# Patient Record
Sex: Female | Born: 1965 | Race: White | Hispanic: No | Smoking: Current every day smoker
Health system: Southern US, Community
[De-identification: ages and names within clinical notes are randomized; demographics above are authoritative.]

## PROBLEM LIST (undated history)

## (undated) DIAGNOSIS — I447 Left bundle-branch block, unspecified: Secondary | ICD-10-CM

## (undated) HISTORY — PX: NECK SURGERY: SHX720

---

## 2007-04-12 ENCOUNTER — Ambulatory Visit: Payer: Self-pay | Admitting: Obstetrics & Gynecology

## 2007-04-12 ENCOUNTER — Encounter: Payer: Self-pay | Admitting: Obstetrics & Gynecology

## 2007-05-09 ENCOUNTER — Ambulatory Visit: Payer: Self-pay | Admitting: Obstetrics & Gynecology

## 2007-06-01 ENCOUNTER — Ambulatory Visit: Payer: Self-pay | Admitting: Plastic Surgery

## 2008-02-05 ENCOUNTER — Ambulatory Visit: Payer: Self-pay | Admitting: Family Medicine

## 2010-04-07 ENCOUNTER — Ambulatory Visit: Payer: Self-pay | Admitting: Obstetrics & Gynecology

## 2010-07-19 ENCOUNTER — Ambulatory Visit: Payer: Self-pay | Admitting: Obstetrics and Gynecology

## 2011-04-26 NOTE — Assessment & Plan Note (Signed)
[name], [name]               ACCOUNT NO.:  [id number]   MEDICAL RECORD NO.:  [id number]          PATIENT TYPE:  POB   LOCATION:  [facility] at [facility]         FACILITY:  [facility]   PHYSICIAN:  Scheryl Darter, MD       DATE OF BIRTH:  02-May-1966   DATE OF SERVICE:                                  CLINIC NOTE   The patient comes for her annual physical exam.  The patient is a 45-  year-old white female, gravida 2, para 1-0-1-1, who has a Mirena in  place since May of 2008.  She occasionally has a light period.  This may  happen every 2 or 3 months.  She does have some cramping during this  period.  She wished to continue with this method of contraception.  [facility]   PAST MEDICAL HISTORY:  1. Overweight.  2. History of anxiety.  3. Tobacco use.   PAST SURGICAL HISTORY:  Breast augmentation and reconstruction.   PAST OBSTETRICAL HISTORY:  One vaginal delivery of a 9-pound baby and  one first trimester miscarriage.   SOCIAL HISTORY:  The patient is a smoker.  She tried quitting in the  past using Chantix.  She occasionally drinks alcohol.  She denies any  abuse or drug use.   FAMILY HISTORY:  Colon cancer in her mother diagnosed at age 4.  She  has sister in her 28s who has premalignant polyps.  Father has prostate  cancer.  No history of breast cancer.   MEDICATIONS:  Phentermine 1 p.o. daily.   ALLERGIES:  AMOXICILLIN.   REVIEW OF SYSTEMS:  The patient has had some weight loss.  She denies  any constitutional symptoms such as fever or increased heart rate,  anxiety, chest pain, shortness of breath, abdominal pain, diarrhea,  constipation, or blood per rectum, urinary urgency or frequency.  No  vaginal discharge.   PHYSICAL EXAMINATION:  GENERAL:  The patient no acute distress.  VITAL SIGNS:  Weight 191 pounds, height 5 feet 7 inches, blood pressure  110/73, pulse 89.  CHEST:  Clear.  HEART:  Regular rhythm.  NECK:  No thyromegaly.  BREASTS:  Symmetric with implants in place.  She has scars on the upper  areas of her nipples due to reconstruction at the time of her  augmentation.  There is no breast masses or lymphadenopathy.  ABDOMEN:  Soft, nontender.  No mass.  EXTREMITIES:  No deformities or swelling.  PELVIC:  External genitalia, vagina and cervix appeared normal with IUD  string about 2 cm.  Pap was done.  Uterus normal size, nontender, no  masses.   IMPRESSION:  1. The patient is doing well with Mirena in place.  2. Family history of colon polyps and cancer.  3. Overweight, currently on weight loss regimen.  4. Smoker.   PLAN:  I urged her to quit smoking.  She should accomplish weight loss  by healthy diet and increased exercise.  We will schedule screening  mammogram.  Colonoscopy will be advised and that she should have  colonoscopy by age 51.  She will return for yearly exam.  Scheryl Darter, MD     JA/MEDQ  D:  04/07/2010  T:  04/07/2010  Job:  [id number]

## 2011-04-26 NOTE — Assessment & Plan Note (Signed)
NAMEJESUSA, [name]               ACCOUNT NO.:  [id number]   MEDICAL RECORD NO.:  [id number]          PATIENT TYPE:  POB   LOCATION:  [facility]         FACILITY:  [facility]   PHYSICIAN:  Argentina Donovan, MD        DATE OF BIRTH:  1966/05/21   DATE OF SERVICE:                                  CLINIC NOTE   HISTORY OF PRESENT ILLNESS:  The patient is a 45 year old Caucasian  female gravida 2, para 1-0-1-1 who had a Mirena placed in May 2008.  She  came in today for 2 reasons:  1. She was concerned about the IUD placement and was worried it may      have changed since she became amenorrhea for a year and then      started having regular periods again, but after being shown the IUD      model and palpating the string, she is convinced that she still      feels the string in it still is in place so that takes care of one      of her problems.  2. When she was in here on April 07, 2010, she weighed 191 pounds.      Some doctor had placed her on phentermine and she started an      exercise program, and she is already down to 168.  She is 5 feet 7      inches.  Her blood pressure is normal 125/75.  We have talked about      the phentermine.  She has reached a plateau at this point, and I      told her that she is probably not working but her main problem is      anxiety.  She had had problems with anxiety before but because of      situational changes it has gotten a lot worse recently.   I have counseled her to stop the phentermine for 2 reasons:  1. At this point, it is not working.  2. If she decides to restart it again and she is maintaining her      weight, she says perfectly fine without even though she is doing      exercises, she is watching what she is eating that she may find      that for short time it may be able to work in the future, so she is      going to do that which I think probably in itself will reduce the      anxiety.   Apparently, she was placed on some Xanax  in the past, but she never  really took them regularly and they had been outdated and she threw some  way.  I talked to about the alternatives using the SSRIs which are very  good for panic attacks which she does not seem to have.  It seems to be  pure anxiety, feelings that sometimes becomes also overwhelming so I  talked to her about trying BuSpar, and I told her how to take that will  give her back the try to have 10 mg a half a pill to  begin with in the  evening after the first week half a pill the morning and evening and  then after that the second week a full pill morning and evening.  See  how she does on that.  In the interim, I have given her 0.2 of Xanax to  take p.r.n. a couple of times a day if needed until she gets to the  point where that is functioning.  She does not seem to have looking her  chart the history of anyone who has been drug-seeking.  She has been  extremely effective on her phentermine, which I think can cause anxiety  in people who have not had anxiety let alone people who are prone to it,  so she has agreed to stop that for the immediate future and try the  BuSpar and see how that works.  If that is not functioning well for her,  I told her we try her on an SSRI, and she is going to keep in touch with  that in mind.  Impression is intermittent acute anxiety episodes, very  effective weight loss results.  She is exercising with a trainer and  seems to be doing very well.          ______________________________  Argentina Donovan, MD    PR/MEDQ  D:  07/19/2010  T:  07/20/2010  Job:  [id number]

## 2011-04-29 NOTE — Assessment & Plan Note (Signed)
[name], [name]               ACCOUNT NO.:  [id number]   MEDICAL RECORD NO.:  [id number]          PATIENT TYPE:  POB   LOCATION:  [facility]         FACILITY:  [facility]   PHYSICIAN:  Elsie Lincoln, MD      DATE OF BIRTH:  02-02-1966   DATE OF SERVICE:  04/12/2007                                  CLINIC NOTE   HISTORY OF PRESENT ILLNESS:  The patient is a 45 year old G2, para 1-0-1-  1 female who is a known patient to Dr. Mart Piggs old practice.  She  presents today for a Pap smear; her last Pap smear was approximately 1-  1/2 to 2 years ago.  She is also interested in changing from condom  contraceptives to a Mirena IUD.  We also talked about the Copper T IUD  and she chose the Mirena over the Copper T.  She is not completely sure  if she is done with childbearing.  The patient is having no problems  today.  She did have some urinary symptoms that seemed to be relieved,  but a urinalysis and urine culture was sent today.   PAST MEDICAL HISTORY:  Denies all chronic and serious medical problems.   PAST SURGICAL HISTORY:  Denies all surgeries.   PAST GYNECOLOGICAL HISTORY:  One vaginal delivery of a 9-pound baby, 1  miscarriage.  No history of abnormal Pap smears.  No history of ovarian  cysts, fibroid tumors or sexually transmitted diseases.  She has very  irregular periods with medium flow, better every 28 days.  Each menses  lasts 3 days.  Menarche was at 45 years of age.   FAMILY HISTORY:  Mom diagnosed with cancer at 14 and her sisters have  precancerous polyps.  I suggested a colonoscopy at age 45.  Her dad has  had prostate cancer.   MEDICATIONS:  None.   ALLERGIES:  None.   SOCIAL HISTORY:  Smoker of 1 pack per day for 20 years and is interested  in quitting.  We will give her Chantix today.  She drinks 2 drinks of  alcohol a week and has approximately 2 caffeinated beverages a day.  She  does not do drugs and has never been a victim of abuse.   IMMUNIZATIONS:  Include chickenpox and flu.   PHYSICAL EXAM:  VITAL SIGNS:  Pulse 74, blood pressure 114/64.  Weight  202.  GENERAL:  Well-nourished, well-developed, in no apparent distress.  HEENT:  Normocephalic, atraumatic.  THYROID.  No masses.  LUNGS:  Clear to auscultation bilaterally in all lung fields.  CARDIOVASCULAR:  Regular rate and rhythm.  BREASTS:  No masses, nontender.  No lymphadenopathy.  No nipple  discharge.  ABDOMEN:  Soft, nontender.  No organomegaly.  No hernia.  The patient  does have an irregular-bordered nevus on her stomach that she thinks has  been there for a while; I suggest she go to a dermatologist.  EXTREMITIES:  Nontender.  No edema.  PELVIC/GU:  External genitalia:  Tanner V.  Vagina pink, normal rugae.  Cervix closed, nontender.  Adnexa:  No masses, nontender.  Uterus small,  mobile and nontender.  Urethra nontender.  Bladder well supported,  nontender.  Perineum intact.  RECTAL:  No hemorrhoids.   ASSESSMENT AND PLAN:  [age]-year-old female for well-woman exam.  1. Pap smear.  2. Cultures of the GC and Chlamydia.  3. Calcium supplementation suggested, given that she consumes little      dairy.  4. Dermatologist for nevus.  5. Chantix prescription.  6. Come in for fasting lipid, glucose and also TSH.  7. Return to clinic on menses for intrauterine device.  8. UA, urine culture.           ______________________________  Elsie Lincoln, MD     KL/MEDQ  D:  04/12/2007  T:  04/12/2007  Job:  [id number]

## 2013-03-23 ENCOUNTER — Emergency Department: Payer: Self-pay | Admitting: Emergency Medicine

## 2013-03-23 LAB — COMPREHENSIVE METABOLIC PANEL
Alkaline Phosphatase: 87 U/L (ref 50–136)
Anion Gap: 7 (ref 7–16)
Bilirubin,Total: 0.3 mg/dL (ref 0.2–1.0)
Calcium, Total: 9.6 mg/dL (ref 8.5–10.1)
Co2: 32 mmol/L (ref 21–32)
SGPT (ALT): 25 U/L (ref 12–78)
Total Protein: 7.2 g/dL (ref 6.4–8.2)

## 2013-03-23 LAB — CBC
HCT: 45.3 % (ref 35.0–47.0)
HGB: 15 g/dL (ref 12.0–16.0)
MCHC: 33.1 g/dL (ref 32.0–36.0)
Platelet: 265 10*3/uL (ref 150–440)
RBC: 4.83 10*6/uL (ref 3.80–5.20)
WBC: 8.9 10*3/uL (ref 3.6–11.0)

## 2013-03-23 LAB — TROPONIN I: Troponin-I: <0.02 ng/mL

## 2015-04-22 ENCOUNTER — Ambulatory Visit: Admission: EM | Admit: 2015-04-22 | Discharge: 2015-04-22 | Discharge status: Absent without leave | Payer: Self-pay

## 2015-04-22 ENCOUNTER — Ambulatory Visit
Admission: EM | Admit: 2015-04-22 | Discharge: 2015-04-22 | Disposition: A | Discharge status: Home / Self Care | Payer: Federal, State, Local not specified - PPO | Attending: Family Medicine | Admitting: Family Medicine

## 2015-04-22 ENCOUNTER — Encounter: Payer: Self-pay | Admitting: Emergency Medicine

## 2015-04-22 DIAGNOSIS — T148 Other injury of unspecified body region: Secondary | ICD-10-CM | POA: Diagnosis not present

## 2015-04-22 DIAGNOSIS — W57XXXA Bitten or stung by nonvenomous insect and other nonvenomous arthropods, initial encounter: Secondary | ICD-10-CM | POA: Diagnosis not present

## 2015-04-22 MED ORDER — DOXYCYCLINE HYCLATE 100 MG PO TABS: 100.0000 mg | ORAL_TABLET | Freq: Two times a day (BID) | ORAL | Status: AC

## 2015-04-22 NOTE — ED Notes (Signed)
Patient c/o tick bite on Thursday of last week on her right thigh.  Patient denies fevers.  Patient has redness at the site.

## 2015-04-22 NOTE — ED Provider Notes (Signed)
SUBJECTIVE: Patient presents for evaluation of a rash involving the right  lower extremity. Rash started 5 days ago. She remembers pulling a tick off on the right leg. There are two red lesions that have gotten bigger and slightly tender.  Admits to being outdoors which rash could be related to. Denies fever. Has not applied OTC products to area. Denies fever, malaise, joint pain, headache.  ROS: Negative except mentioned above.  OBJECTIVE: Vitals as listed. GENERAL: NAD HEENT: no pharyngeal erythema, no exudate, no erythema of TMs CARDIOVASCULAR: RRR RESPIRATORY: CTA B SKIN: erythematous quarter sized slightly indurated area on right thigh, smaller area next to it, no discharge appreciated, no streaks  ASSESSMENT/PLAN:  Tick Bite/Rash - Doxycycline prescribed. Keep areas dry and clean. Monitor for any worsening symptoms. Warm compress if needed to promote draining. Seek medical attention if symptoms persist or worsen.        Jolene ProvostKirtida Pluma Diniz, MD 04/22/15 [phone]

## 2015-04-22 NOTE — Discharge Instructions (Signed)
Tick Bite Information Ticks are insects that attach themselves to the skin and draw blood for food. There are various types of ticks. Common types include wood ticks and deer ticks. Most ticks live in shrubs and grassy areas. Ticks can climb onto your body when you make contact with leaves or grass where the tick is waiting. The most common places on the body for ticks to attach themselves are the scalp, neck, armpits, waist, and groin. Most tick bites are harmless, but sometimes ticks carry germs that cause diseases. These germs can be spread to a person during the tick's feeding process. The chance of a disease spreading through a tick bite depends on:   The type of tick.  Time of year.   How long the tick is attached.   Geographic location.  HOW CAN YOU PREVENT TICK BITES? Take these steps to help prevent tick bites when you are outdoors:  Wear protective clothing. Long sleeves and long pants are best.   Wear white clothes so you can see ticks more easily.  Tuck your pant legs into your socks.   If walking on a trail, stay in the middle of the trail to avoid brushing against bushes.  Avoid walking through areas with long grass.  Put insect repellent on all exposed skin and along boot tops, pant legs, and sleeve cuffs.   Check clothing, hair, and skin repeatedly and before going inside.   Brush off any ticks that are not attached.  Take a shower or bath as soon as possible after being outdoors.  WHAT IS THE PROPER WAY TO REMOVE A TICK? Ticks should be removed as soon as possible to help prevent diseases caused by tick bites. 1. If latex gloves are available, put them on before trying to remove a tick.  2. Using fine-point tweezers, grasp the tick as close to the skin as possible. You may also use curved forceps or a tick removal tool. Grasp the tick as close to its head as possible. Avoid grasping the tick on its body. 3. Pull gently with steady upward pressure until  the tick lets go. Do not twist the tick or jerk it suddenly. This may break off the tick's head or mouth parts. 4. Do not squeeze or crush the tick's body. This could force disease-carrying fluids from the tick into your body.  5. After the tick is removed, wash the bite area and your hands with soap and water or other disinfectant such as alcohol. 6. Apply a small amount of antiseptic cream or ointment to the bite site.  7. Wash and disinfect any instruments that were used.  Do not try to remove a tick by applying a hot match, petroleum jelly, or fingernail polish to the tick. These methods do not work and may increase the chances of disease being spread from the tick bite.  WHEN SHOULD YOU SEEK MEDICAL CARE? Contact your health care provider if you are unable to remove a tick from your skin or if a part of the tick breaks off and is stuck in the skin.  After a tick bite, you need to be aware of signs and symptoms that could be related to diseases spread by ticks. Contact your health care provider if you develop any of the following in the days or weeks after the tick bite:  Unexplained fever.  Rash. A circular rash that appears days or weeks after the tick bite may indicate the possibility of Lyme disease. The rash may resemble   a target with a bull's-eye and may occur at a different part of your body than the tick bite.  Redness and swelling in the area of the tick bite.   Tender, swollen lymph glands.   Diarrhea.   Weight loss.   Cough.   Fatigue.   Muscle, joint, or bone pain.   Abdominal pain.   Headache.   Lethargy or a change in your level of consciousness.  Difficulty walking or moving your legs.   Numbness in the legs.   Paralysis.  Shortness of breath.   Confusion.   Repeated vomiting.  Document Released: 11/25/2000 Document Revised: 09/18/2013 Document Reviewed: 05/08/2013 ExitCare Patient Information 2015 [employer]. This information is  not intended to replace advice given to you by your health care provider. Make sure you discuss any questions you have with your health care provider.  

## 2015-05-20 DIAGNOSIS — R9431 Abnormal electrocardiogram [ECG] [EKG]: Secondary | ICD-10-CM | POA: Insufficient documentation

## 2015-05-20 DIAGNOSIS — R030 Elevated blood-pressure reading, without diagnosis of hypertension: Secondary | ICD-10-CM | POA: Insufficient documentation

## 2015-05-20 DIAGNOSIS — R079 Chest pain, unspecified: Secondary | ICD-10-CM | POA: Insufficient documentation

## 2016-05-19 ENCOUNTER — Telehealth: Payer: Self-pay | Admitting: Gastroenterology

## 2016-05-19 NOTE — Telephone Encounter (Signed)
[facility]

## 2016-05-20 NOTE — Telephone Encounter (Signed)
LVM for pt to return my call.

## 2016-05-23 NOTE — Telephone Encounter (Signed)
Patient is returning your call.  

## 2016-05-26 ENCOUNTER — Telehealth: Payer: Self-pay

## 2016-05-26 ENCOUNTER — Other Ambulatory Visit: Payer: Self-pay

## 2016-05-26 NOTE — Telephone Encounter (Signed)
[facility]. Family hx of colon cancer Z80.0

## 2016-05-26 NOTE — Telephone Encounter (Signed)
Gastroenterology Pre-Procedure Review  Request Date: 06/13/16 Requesting Physician: Dr.   PATIENT REVIEW QUESTIONS: The patient responded to the following health history questions as indicated:    1. Are you having any GI issues? no 2. Do you have a personal history of Polyps? no 3. Do you have a family history of Colon Cancer or Polyps? yes (Mother and sisters) 4. Diabetes Mellitus? no 5. Joint replacements in the past 12 months?no 6. Major health problems in the past 3 months?no 7. Any artificial heart valves, MVP, or defibrillator?no    MEDICATIONS & ALLERGIES:    Patient reports the following regarding taking any anticoagulation/antiplatelet therapy:   Plavix, Coumadin, Eliquis, Xarelto, Lovenox, Pradaxa, Brilinta, or Effient? no Aspirin? no  Patient confirms/reports the following medications:  Current Outpatient Prescriptions  Medication Sig Dispense Refill  . phentermine (ADIPEX-P) 37.5 MG tablet Take 37.5 mg by mouth daily before breakfast.     No current facility-administered medications for this visit.    Patient confirms/reports the following allergies:  Allergies  Allergen Reactions  . Amoxicillin Hives    No orders of the defined types were placed in this encounter.    AUTHORIZATION INFORMATION Primary Insurance: 1D#: Group #:  Secondary Insurance: 1D#: Group #:  SCHEDULE INFORMATION: Date: 06/13/16 Time: Location: [facility]

## 2016-06-09 NOTE — Discharge Instructions (Signed)

## 2016-06-13 ENCOUNTER — Encounter
Admission: RE | Disposition: A | Discharge status: Home / Self Care | Payer: Self-pay | Source: Ambulatory Visit | Attending: Gastroenterology

## 2016-06-13 ENCOUNTER — Ambulatory Visit
Admission: RE | Admit: 2016-06-13 | Discharge: 2016-06-13 | Disposition: A | Discharge status: Home / Self Care | Payer: Federal, State, Local not specified - PPO | Source: Ambulatory Visit | Attending: Gastroenterology | Admitting: Gastroenterology

## 2016-06-13 ENCOUNTER — Ambulatory Visit: Payer: Federal, State, Local not specified - PPO | Admitting: Anesthesiology

## 2016-06-13 DIAGNOSIS — K641 Second degree hemorrhoids: Secondary | ICD-10-CM | POA: Diagnosis not present

## 2016-06-13 DIAGNOSIS — F17210 Nicotine dependence, cigarettes, uncomplicated: Secondary | ICD-10-CM | POA: Insufficient documentation

## 2016-06-13 DIAGNOSIS — Z1211 Encounter for screening for malignant neoplasm of colon: Secondary | ICD-10-CM | POA: Diagnosis not present

## 2016-06-13 DIAGNOSIS — Z7982 Long term (current) use of aspirin: Secondary | ICD-10-CM | POA: Insufficient documentation

## 2016-06-13 DIAGNOSIS — Z881 Allergy status to other antibiotic agents status: Secondary | ICD-10-CM | POA: Diagnosis not present

## 2016-06-13 DIAGNOSIS — Z79899 Other long term (current) drug therapy: Secondary | ICD-10-CM | POA: Diagnosis not present

## 2016-06-13 HISTORY — DX: Left bundle-branch block, unspecified: I44.7

## 2016-06-13 HISTORY — PX: COLONOSCOPY WITH PROPOFOL: SHX5780

## 2016-06-13 SURGERY — COLONOSCOPY WITH PROPOFOL
Anesthesia: Monitor Anesthesia Care | Wound class: Contaminated

## 2016-06-13 MED ORDER — STERILE WATER FOR IRRIGATION IR SOLN
Status: DC | PRN
  Administered 2016-06-13: 10:00:00

## 2016-06-13 MED ORDER — LIDOCAINE HCL (CARDIAC) 20 MG/ML IV SOLN
INTRAVENOUS | Status: DC | PRN
  Administered 2016-06-13: 50 mg via INTRAVENOUS

## 2016-06-13 MED ORDER — PROPOFOL 10 MG/ML IV BOLUS
INTRAVENOUS | Status: DC | PRN
  Administered 2016-06-13: 70 mg via INTRAVENOUS
  Administered 2016-06-13 (×2): 20 mg via INTRAVENOUS
  Administered 2016-06-13: 30 mg via INTRAVENOUS
  Administered 2016-06-13 (×2): 20 mg via INTRAVENOUS
  Administered 2016-06-13: 30 mg via INTRAVENOUS
  Administered 2016-06-13 (×2): 20 mg via INTRAVENOUS
  Administered 2016-06-13: 30 mg via INTRAVENOUS

## 2016-06-13 MED ORDER — ACETAMINOPHEN 325 MG PO TABS: 325.0000 mg | ORAL_TABLET | ORAL | Status: DC | PRN

## 2016-06-13 MED ORDER — ACETAMINOPHEN 160 MG/5ML PO SOLN: 325.0000 mg | ORAL | Status: DC | PRN

## 2016-06-13 MED ORDER — LACTATED RINGERS IV SOLN
INTRAVENOUS | Status: DC
  Administered 2016-06-13: 09:00:00 via INTRAVENOUS

## 2016-06-13 SURGICAL SUPPLY — 23 items
CANISTER SUCT 1200ML W/VALVE (MISCELLANEOUS) ×2 IMPLANT
CLIP HMST 235XBRD CATH ROT (MISCELLANEOUS) IMPLANT
CLIP RESOLUTION 360 11X235 (MISCELLANEOUS)
FCP ESCP3.2XJMB 240X2.8X (MISCELLANEOUS)
FORCEPS BIOP RAD 4 LRG CAP 4 (CUTTING FORCEPS) IMPLANT
FORCEPS BIOP RJ4 240 W/NDL (MISCELLANEOUS)
FORCEPS ESCP3.2XJMB 240X2.8X (MISCELLANEOUS) IMPLANT
GOWN CVR UNV OPN BCK APRN NK (MISCELLANEOUS) ×2 IMPLANT
GOWN ISOL THUMB LOOP REG UNIV (MISCELLANEOUS) ×4
INJECTOR VARIJECT VIN23 (MISCELLANEOUS) IMPLANT
KIT DEFENDO VALVE AND CONN (KITS) IMPLANT
KIT ENDO PROCEDURE OLY (KITS) ×2 IMPLANT
MARKER SPOT ENDO TATTOO 5ML (MISCELLANEOUS) IMPLANT
PAD GROUND ADULT SPLIT (MISCELLANEOUS) IMPLANT
PROBE APC STR FIRE (PROBE) IMPLANT
RETRIEVER NET ROTH 2.5X230 LF (MISCELLANEOUS) ×2 IMPLANT
SNARE SHORT THROW 13M SML OVAL (MISCELLANEOUS) IMPLANT
SNARE SHORT THROW 30M LRG OVAL (MISCELLANEOUS) IMPLANT
SNARE SNG USE RND 15MM (INSTRUMENTS) IMPLANT
SPOT EX ENDOSCOPIC TATTOO (MISCELLANEOUS)
TRAP ETRAP POLY (MISCELLANEOUS) IMPLANT
VARIJECT INJECTOR VIN23 (MISCELLANEOUS)
WATER STERILE IRR 250ML POUR (IV SOLUTION) ×2 IMPLANT

## 2016-06-13 NOTE — H&P (Signed)
  Midge Miniumarren Catrinia Racicot, MD [facility] [address] [place], [place] [address] Phone: [phone] Fax : [phone]  Primary Care Physician:  No PCP Per Patient Primary Gastroenterologist:  Dr. Servando SnareWohl  Pre-Procedure History & Physical: HPI:  [name] is a 50 y.o. female is here for a screening colonoscopy.   Past Medical History  Diagnosis Date  . Left bundle branch block     Past Surgical History  Procedure Laterality Date  . Neck surgery      Prior to Admission medications   Medication Sig Start Date End Date Taking? Authorizing Provider  aspirin 81 MG tablet Take 81 mg by mouth daily as needed for pain.   Yes Historical Provider, MD  hydrochlorothiazide (HYDRODIURIL) 12.5 MG tablet Take 12.5 mg by mouth as needed.   Yes Historical Provider, MD  phentermine (ADIPEX-P) 37.5 MG tablet Take 37.5 mg by mouth daily before breakfast. Reported on 06/07/2016    Historical Provider, MD    Allergies as of 05/26/2016 - Review Complete 04/22/2015  Allergen Reaction Noted  . Amoxicillin Hives 04/22/2015    Family History  Problem Relation Age of Onset  . Cancer Mother   . Cancer Father     Social History   Social History  . Marital Status: Married    Spouse Name: N/A  . Number of Children: N/A  . Years of Education: N/A   Occupational History  . Not on file.   Social History Main Topics  . Smoking status: Current Every Day Smoker    Types: Cigarettes  . Smokeless tobacco: Never Used  . Alcohol Use: No  . Drug Use: No  . Sexual Activity: Not on file   Other Topics Concern  . Not on file   Social History Narrative    Review of Systems: See HPI, otherwise negative ROS  Physical Exam: BP 98/68 mmHg  Pulse 93  Temp(Src) 98.2 F (36.8 C) (Temporal)  Resp 16  Ht 5\' 7"  (1.702 m)  Wt 204 lb (92.534 kg)  BMI 31.94 kg/m2  SpO2 97%  LMP 06/02/2016 (Approximate) General:   Alert,  pleasant and cooperative in NAD Head:  Normocephalic and atraumatic. Neck:   Supple; no masses or thyromegaly. Lungs:  Clear throughout to auscultation.    Heart:  Regular rate and rhythm. Abdomen:  Soft, nontender and nondistended. Normal bowel sounds, without guarding, and without rebound.   Neurologic:  Alert and  oriented x4;  grossly normal neurologically.  Impression/Plan: [name] is now here to undergo a screening colonoscopy.  Risks, benefits, and alternatives regarding colonoscopy have been reviewed with the patient.  Questions have been answered.  All parties agreeable.

## 2016-06-13 NOTE — Op Note (Signed)
[facility]astroenterology Patient Name: [name] Procedure Date: 06/13/2016 10:11 AM MRN: [id number] Account #: [id number] Date of Birth: [birth year] Admit Type: Outpatient Age: [age] [facility] Gender: Female Note Status: Finalized Procedure:            Colonoscopy Indications:          Screening for colorectal malignant neoplasm Providers:            Midge Miniumarren Maritza Hosterman, MD Referring MD:         Beryle QuantLynde G. Toya SmothersKnowles Jonas, MD (Referring MD) Medicines:            Propofol per Anesthesia Complications:        No immediate complications. Procedure:            Pre-Anesthesia Assessment:                       - Prior to the procedure, a History and Physical was                        performed, and patient medications and allergies were                        reviewed. The patient's tolerance of previous                        anesthesia was also reviewed. The risks and benefits of                        the procedure and the sedation options and risks were                        discussed with the patient. All questions were                        answered, and informed consent was obtained. Prior                        Anticoagulants: The patient has taken no previous                        anticoagulant or antiplatelet agents. ASA Grade                        Assessment: II - A patient with mild systemic disease.                        After reviewing the risks and benefits, the patient was                        deemed in satisfactory condition to undergo the                        procedure.                       After obtaining informed consent, the colonoscope was                        passed under direct vision. Throughout the procedure,  the patient's blood pressure, pulse, and oxygen                        saturations were monitored continuously. The Olympus CF                        H180AL colonoscope (S#: [id number]) was introduced  through                        the anus and advanced to the the cecum, identified by                        appendiceal orifice and ileocecal valve. The                        colonoscopy was performed without difficulty. The                        patient tolerated the procedure well. The quality of                        the bowel preparation was excellent. Findings:      The perianal and digital rectal examinations were normal.      Non-bleeding internal hemorrhoids were found during retroflexion. The       hemorrhoids were Grade II (internal hemorrhoids that prolapse but reduce       spontaneously). Impression:           - Non-bleeding internal hemorrhoids.                       - No specimens collected. Recommendation:       - Repeat colonoscopy in 5 years for surveillance. Procedure Code(s):    --- Professional ---                       [phone], Colonoscopy, flexible; diagnostic, including                        collection of specimen(s) by brushing or washing, when                        performed (separate procedure) Diagnosis Code(s):    --- Professional ---                       Z12.11, Encounter for screening for malignant neoplasm                        of colon CPT copyright 2016 American Medical Association. All rights reserved. The codes documented in this report are preliminary and upon coder review may  be revised to meet current compliance requirements. Midge Miniumarren Dae Antonucci, MD 06/13/2016 10:35:25 AM This report has been signed electronically. Number of Addenda: 0 Note Initiated On: 06/13/2016 10:11 AM Scope Withdrawal Time: 0 hours 6 minutes 21 seconds  Total Procedure Duration: 0 hours 10 minutes 46 seconds       [facility]

## 2016-06-13 NOTE — Anesthesia Preprocedure Evaluation (Signed)
Anesthesia Evaluation  Patient identified by MRN, date of birth, ID band  Reviewed: Allergy & Precautions, H&P , NPO status , Patient's Chart, lab work & pertinent test results  Airway Mallampati: III  TM Distance: >3 FB Neck ROM: full    Dental no notable dental hx.    Pulmonary Current Smoker,    Pulmonary exam normal        Cardiovascular  Rhythm:regular Rate:Normal     Neuro/Psych    GI/Hepatic   Endo/Other    Renal/GU      Musculoskeletal   Abdominal   Peds  Hematology   Anesthesia Other Findings   Reproductive/Obstetrics                             Anesthesia Physical Anesthesia Plan  ASA: II  Anesthesia Plan: MAC   Post-op Pain Management:    Induction:   Airway Management Planned:   Additional Equipment:   Intra-op Plan:   Post-operative Plan:   Informed Consent: I have reviewed the patients History and Physical, chart, labs and discussed the procedure including the risks, benefits and alternatives for the proposed anesthesia with the patient or authorized representative who has indicated his/her understanding and acceptance.     Plan Discussed with: CRNA  Anesthesia Plan Comments:         Anesthesia Quick Evaluation

## 2016-06-13 NOTE — Transfer of Care (Signed)
Immediate Anesthesia Transfer of Care Note  Patient: [name]  Procedure(s) Performed: Procedure(s): COLONOSCOPY WITH PROPOFOL (N/A)  Patient Location: PACU  Anesthesia Type: MAC  Level of Consciousness: awake, alert  and patient cooperative  Airway and Oxygen Therapy: Patient Spontanous Breathing and Patient connected to supplemental oxygen  Post-op Assessment: Post-op Vital signs reviewed, Patient's Cardiovascular Status Stable, Respiratory Function Stable, Patent Airway and No signs of Nausea or vomiting  Post-op Vital Signs: Reviewed and stable  Complications: No apparent anesthesia complications

## 2016-06-13 NOTE — Anesthesia Postprocedure Evaluation (Signed)
Anesthesia Post Note  Patient: [name]  Procedure(s) Performed: Procedure(s) (LRB): COLONOSCOPY WITH PROPOFOL (N/A)  Patient location during evaluation: PACU Anesthesia Type: MAC Level of consciousness: awake and alert and oriented Pain management: satisfactory to patient Vital Signs Assessment: post-procedure vital signs reviewed and stable Respiratory status: spontaneous breathing, nonlabored ventilation and respiratory function stable Cardiovascular status: blood pressure returned to baseline and stable Postop Assessment: Adequate PO intake and No signs of nausea or vomiting Anesthetic complications: no    Cherly BeachStella, Maretta Overdorf J

## 2016-06-13 NOTE — Anesthesia Procedure Notes (Signed)
Procedure Name: MAC Performed by: Mare Ludtke Pre-anesthesia Checklist: Patient identified, Emergency Drugs available, Suction available, Timeout performed and Patient being monitored Patient Re-evaluated:Patient Re-evaluated prior to inductionOxygen Delivery Method: Nasal cannula Placement Confirmation: positive ETCO2       

## 2016-06-15 ENCOUNTER — Encounter: Payer: Self-pay | Admitting: Gastroenterology

## 2018-02-08 ENCOUNTER — Emergency Department
Admission: EM | Admit: 2018-02-08 | Discharge: 2018-02-08 | Disposition: A | Discharge status: Home / Self Care | Payer: Federal, State, Local not specified - PPO | Attending: Emergency Medicine | Admitting: Emergency Medicine

## 2018-02-08 ENCOUNTER — Encounter: Payer: Self-pay | Admitting: Emergency Medicine

## 2018-02-08 ENCOUNTER — Other Ambulatory Visit: Payer: Self-pay

## 2018-02-08 ENCOUNTER — Emergency Department: Payer: Federal, State, Local not specified - PPO

## 2018-02-08 DIAGNOSIS — R200 Anesthesia of skin: Secondary | ICD-10-CM | POA: Insufficient documentation

## 2018-02-08 DIAGNOSIS — R079 Chest pain, unspecified: Secondary | ICD-10-CM | POA: Insufficient documentation

## 2018-02-08 DIAGNOSIS — I447 Left bundle-branch block, unspecified: Secondary | ICD-10-CM | POA: Insufficient documentation

## 2018-02-08 DIAGNOSIS — R202 Paresthesia of skin: Secondary | ICD-10-CM

## 2018-02-08 DIAGNOSIS — F17210 Nicotine dependence, cigarettes, uncomplicated: Secondary | ICD-10-CM | POA: Diagnosis not present

## 2018-02-08 LAB — CBC
HEMATOCRIT: 42.5 % (ref 35.0–47.0)
Hemoglobin: 14.3 g/dL (ref 12.0–16.0)
MCH: 31 pg (ref 26.0–34.0)
MCHC: 33.5 g/dL (ref 32.0–36.0)
MCV: 92.5 fL (ref 80.0–100.0)
PLATELETS: 314 10*3/uL (ref 150–440)
RBC: 4.6 MIL/uL (ref 3.80–5.20)
RDW: 13.3 % (ref 11.5–14.5)
WBC: 10.1 10*3/uL (ref 3.6–11.0)

## 2018-02-08 LAB — BASIC METABOLIC PANEL
Anion gap: 9 (ref 5–15)
BUN: 11 mg/dL (ref 6–20)
CALCIUM: 9 mg/dL (ref 8.9–10.3)
CO2: 25 mmol/L (ref 22–32)
Chloride: 108 mmol/L (ref 101–111)
Creatinine, Ser: 0.93 mg/dL (ref 0.44–1.00)
GFR calc Af Amer: >60 mL/min (ref >60)
GLUCOSE: 104 mg/dL — AB (ref 65–99)
POTASSIUM: 3.5 mmol/L (ref 3.5–5.1)
SODIUM: 142 mmol/L (ref 135–145)

## 2018-02-08 LAB — TROPONIN I

## 2018-02-08 NOTE — ED Provider Notes (Signed)
[facility] Emergency Department Provider Note  ____________________________________________  Time seen: Approximately 5:44 PM  I have reviewed the triage vital signs and the nursing notes.   HISTORY  Chief Complaint Chest Pain    HPI [name] is a 52 y.o. female with a history of left bundle branch block presenting with chest pain.  The patient reports that for the last several weeks, she has had intermittent brief episodes lasting approximately 15 seconds of a "squeeze" in the left chest which is mild.  This is not associated with any lightheadedness, syncope, palpitations, shortness of breath, diaphoresis, nausea or vomiting.  Her chest pain is not related to exertion, position or food.  There is nothing she can do to make it better or worse.  Today, the patient had a similar episode, but it was associated with left upper extremity numbness and tingling, and was more persistent than her prior episodes.  The patient has a history of reportedly normal stress test in 2015.  At this time, the patient is asymptomatic.  She has an appointment to see Dr. Darrold Junker tomorrow at 10:30 AM  SH: Positive tobacco.  Negative cocaine.  FH: No family history of blood clots.  No family history of early CAD.   Past Medical History:  Diagnosis Date  . Left bundle branch block     Patient Active Problem List   Diagnosis Date Noted  . Special screening for malignant neoplasms, colon   . Abnormal ECG 05/20/2015  . Elevated blood pressure reading without diagnosis of hypertension 05/20/2015  . Chest pain 05/20/2015    Past Surgical History:  Procedure Laterality Date  . COLONOSCOPY WITH PROPOFOL N/A 06/13/2016   Procedure: COLONOSCOPY WITH PROPOFOL;  Surgeon: Midge Minium, MD;  Location: [facility];  Service: Endoscopy;  Laterality: N/A;  . NECK SURGERY      Current Outpatient Rx  . Order #: [id number] Class: Historical Med  . Order #: [id number] Class:  Historical Med  . Order #: [id number] Class: Historical Med    Allergies Amoxicillin  Family History  Problem Relation Age of Onset  . Cancer Mother   . Cancer Father     Social History Social History   Tobacco Use  . Smoking status: Current Every Day Smoker    Packs/day: 0.50    Types: Cigarettes  . Smokeless tobacco: Never Used  Substance Use Topics  . Alcohol use: No  . Drug use: No    Review of Systems Constitutional: No fever/chills.  No lightheadedness or syncope. Eyes: No visual changes. ENT: No sore throat. No congestion or rhinorrhea. Cardiovascular: Positive chest pain. Denies palpitations. Respiratory: Denies shortness of breath.  No cough. Gastrointestinal: No abdominal pain.  No nausea, no vomiting.  No diarrhea.  No constipation. Genitourinary: Negative for dysuria. Musculoskeletal: Negative for back pain. Skin: Negative for rash. Neurological: Negative for headaches. No focal numbness, weakness.  Positive left upper extremity tingling.  No difficulty walking.  No changes in vision, speech or mental status..    ____________________________________________   PHYSICAL EXAM:  VITAL SIGNS: ED Triage Vitals  Enc Vitals Group     BP 02/08/18 1438 133/72     Pulse Rate 02/08/18 1438 94     Resp 02/08/18 1438 18     Temp 02/08/18 1439 98.4 F (36.9 C)     Temp Source 02/08/18 1438 Oral     SpO2 02/08/18 1438 98 %     Weight 02/08/18 1439 204 lb (92.5 kg)  Height 02/08/18 1439 5\' 7"  (1.702 m)     Head Circumference --      Peak Flow --      Pain Score 02/08/18 1438 0     Pain Loc --      Pain Edu? --      Excl. in GC? --     Constitutional: Alert and oriented. Well appearing and in no acute distress. Answers questions appropriately. Eyes: Conjunctivae are normal.  EOMI. No scleral icterus. Head: Atraumatic. Nose: No congestion/rhinnorhea. Mouth/Throat: Mucous membranes are moist.  Neck: No stridor.  Supple.  No JVD.  No  meningismus. Cardiovascular: Normal rate, regular rhythm. No murmurs, rubs or gallops.  Respiratory: Normal respiratory effort.  No accessory muscle use or retractions. Lungs CTAB.  No wheezes, rales or ronchi. Gastrointestinal: Overweight.  Soft, nontender and nondistended.  No guarding or rebound.  No peritoneal signs. Musculoskeletal: No LE edema. No ttp in the calves or palpable cords.  Negative Homan's sign. Neurologic:  A&Ox3.  Speech is clear.  Face and smile are symmetric.  EOMI.  Moves all extremities well. Skin:  Skin is warm, dry and intact. No rash noted. Psychiatric: Mood and affect are normal. Speech and behavior are normal.  Normal judgement.  ____________________________________________   LABS (all labs ordered are listed, but only abnormal results are displayed)  Labs Reviewed  BASIC METABOLIC PANEL - Abnormal; Notable for the following components:      Result Value   Glucose, Bld 104 (*)    All other components within normal limits  CBC  TROPONIN I  TROPONIN I  POC URINE PREG, ED   ____________________________________________  EKG  ED ECG REPORT I, Rockne Menghini, the attending physician, personally viewed and interpreted this ECG.   Date: 02/08/2018  EKG Time: 1433  Rate: 103  Rhythm: sinus tachycardia; LBBB  Axis: normal  Intervals:none  ST&T Change: No STEMI  This EKG is compared to 2014, with the patient did not have a left bundle branch block.  However, she states that in 2015 she went to a diet center and ever since then has been told that she has a left bundle branch block.  She was told up by Dr. Ann Maki is at the time, who did a stress test that was reportedly normal, and she was discharged from his care.  ____________________________________________  RADIOLOGY  Dg Chest 2 View  Result Date: 02/08/2018 CLINICAL DATA:  Left-sided chest pain EXAM: CHEST  2 VIEW COMPARISON:  03/23/2013 FINDINGS: Surgical changes in the cervical spine. Mild  bronchitic changes. No acute consolidation or effusion. Normal heart size. No pneumothorax. IMPRESSION: No active cardiopulmonary disease.  Mild bronchitic changes Electronically Signed   By: Jasmine Pang M.D.   On: 02/08/2018 15:14    ____________________________________________   PROCEDURES  Procedure(s) performed: None  Procedures  Critical Care performed: No ____________________________________________   INITIAL IMPRESSION / ASSESSMENT AND PLAN / ED COURSE  Pertinent labs & imaging results that were available during my care of the patient were reviewed by me and considered in my medical decision making (see chart for details).  52 y.o. female presenting with atypical chest pain.  Overall, the patient is hemodynamically stable with normal vital signs.  Her EKG does show left bundle branch block but does not meet scar Bosa criteria.  The patient's symptoms are not related to exertion.  I do not see any evidence of ischemia on her EKG, and she has negative troponin x2.  Her chest x-ray does not show  any acute cardiopulmonary process.  There is no evidence for ACS or MI.  I do not suspect PE or aortic pathology.  There is no history that would be suggestive of infectious etiology.  The patient has follow-up with her cardiologist tomorrow, and I have had a long discussion with the patient and her husband about return precautions.  ____________________________________________  FINAL CLINICAL IMPRESSION(S) / ED DIAGNOSES  Final diagnoses:  Chest pain, unspecified type  Numbness and tingling in left arm  Left bundle branch block         NEW MEDICATIONS STARTED DURING THIS VISIT:  Discharge Medication List as of 02/08/2018  6:20 PM        Rockne MenghiniNorman, Anne-Caroline, MD 02/08/18 [id number]

## 2018-02-08 NOTE — ED Triage Notes (Signed)
Pt states intermittent cp began a week ago, has been worsening today, states tingling in her left arm, usually has it off and on, however all day today.Pt appears in no distress at this time.

## 2018-02-08 NOTE — Discharge Instructions (Signed)
Please return to the emergency department if you develop severe pain, chest pain, lightheadedness or fainting, palpitations, shortness of breath, sweatiness or clamminess, nausea or vomiting, or any other symptoms concerning to you.

## 2018-06-29 IMAGING — CR DG CHEST 2V
1 series · 2 of 2 positions shown · non-contrast
Comparison: 03/23/2013

CLINICAL DATA: Left-sided chest pain

EXAM:
CHEST  2 VIEW

[Series 1: dg chest 2 view · 0.14mm/px · 2 of 2 slices shown]
[im 1/2]
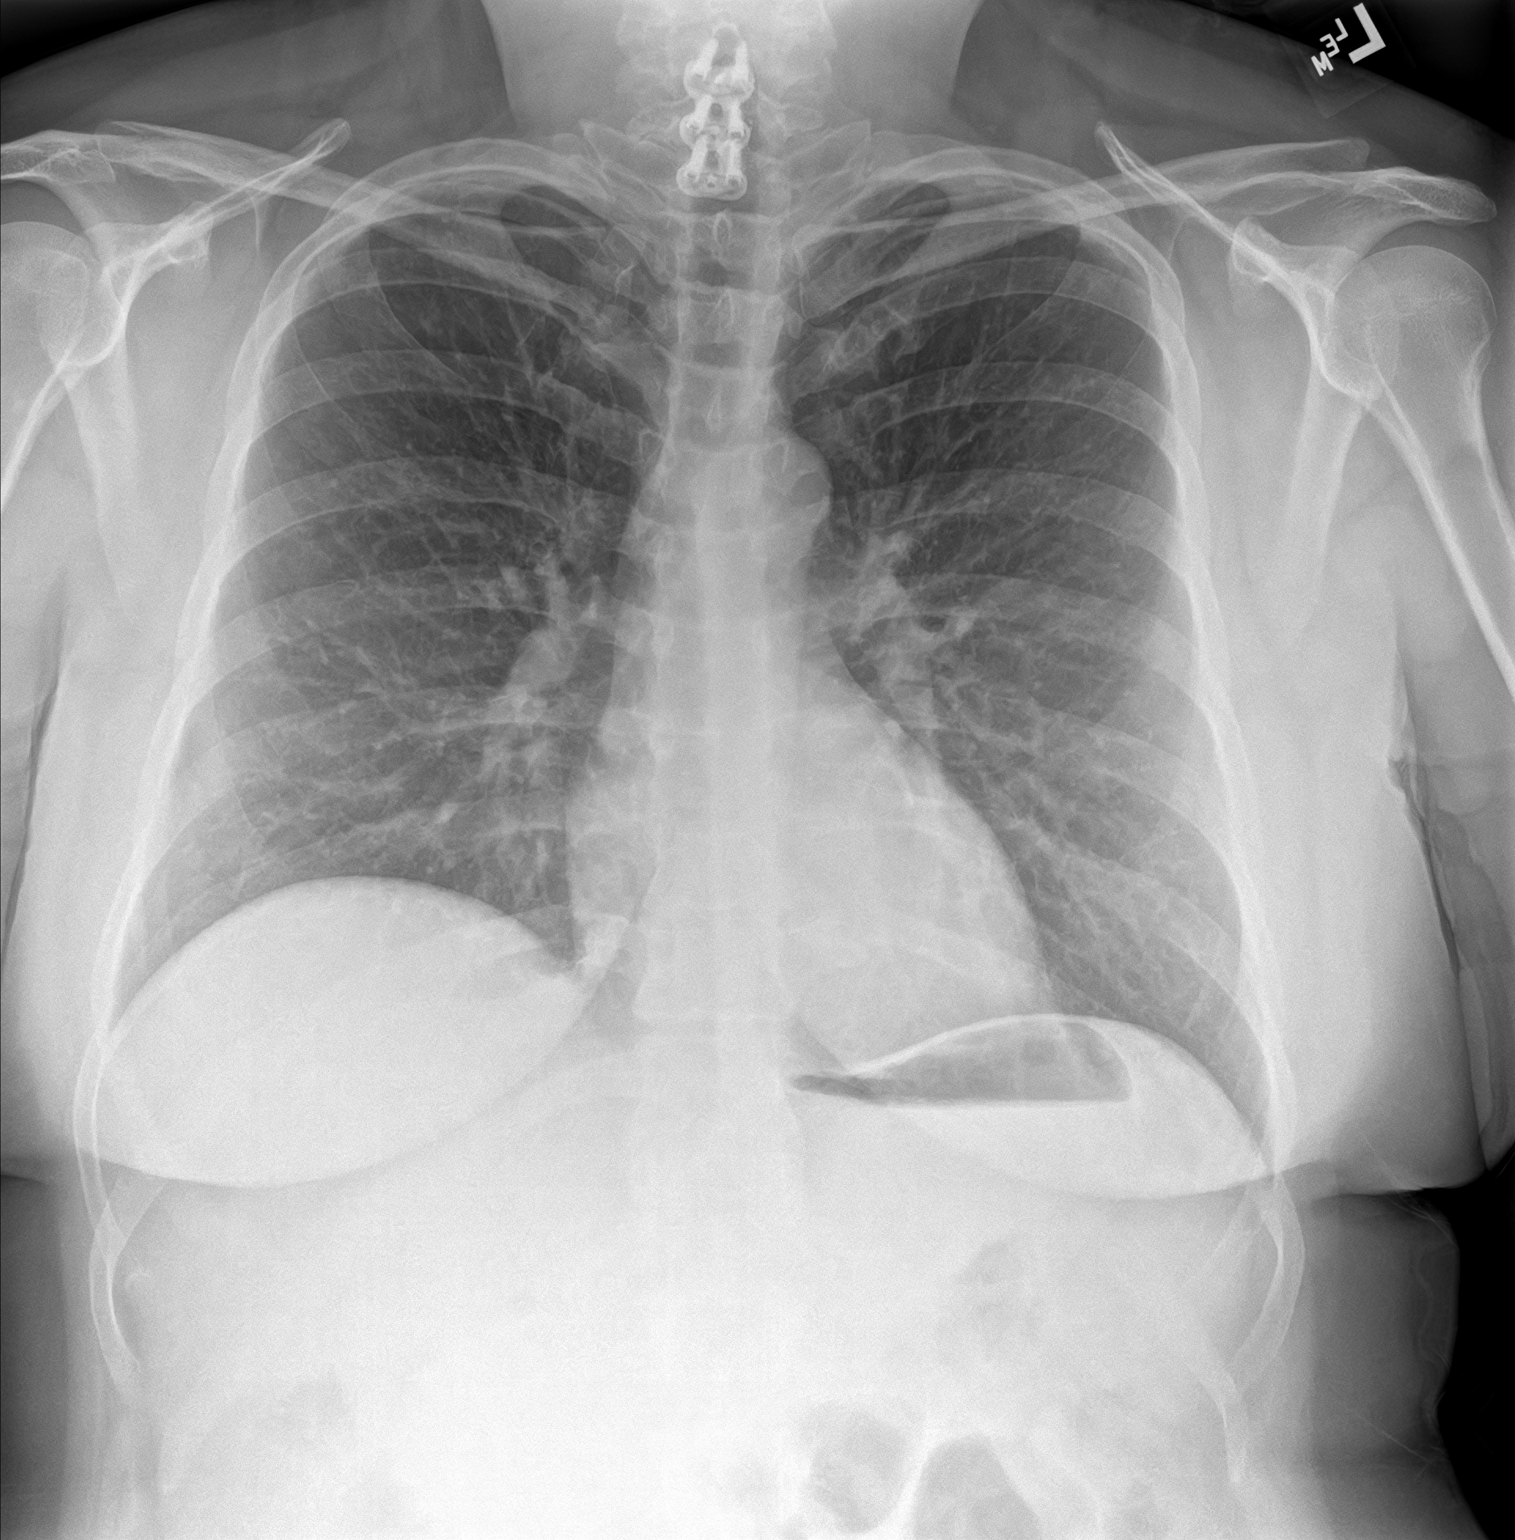
[im 2/2]
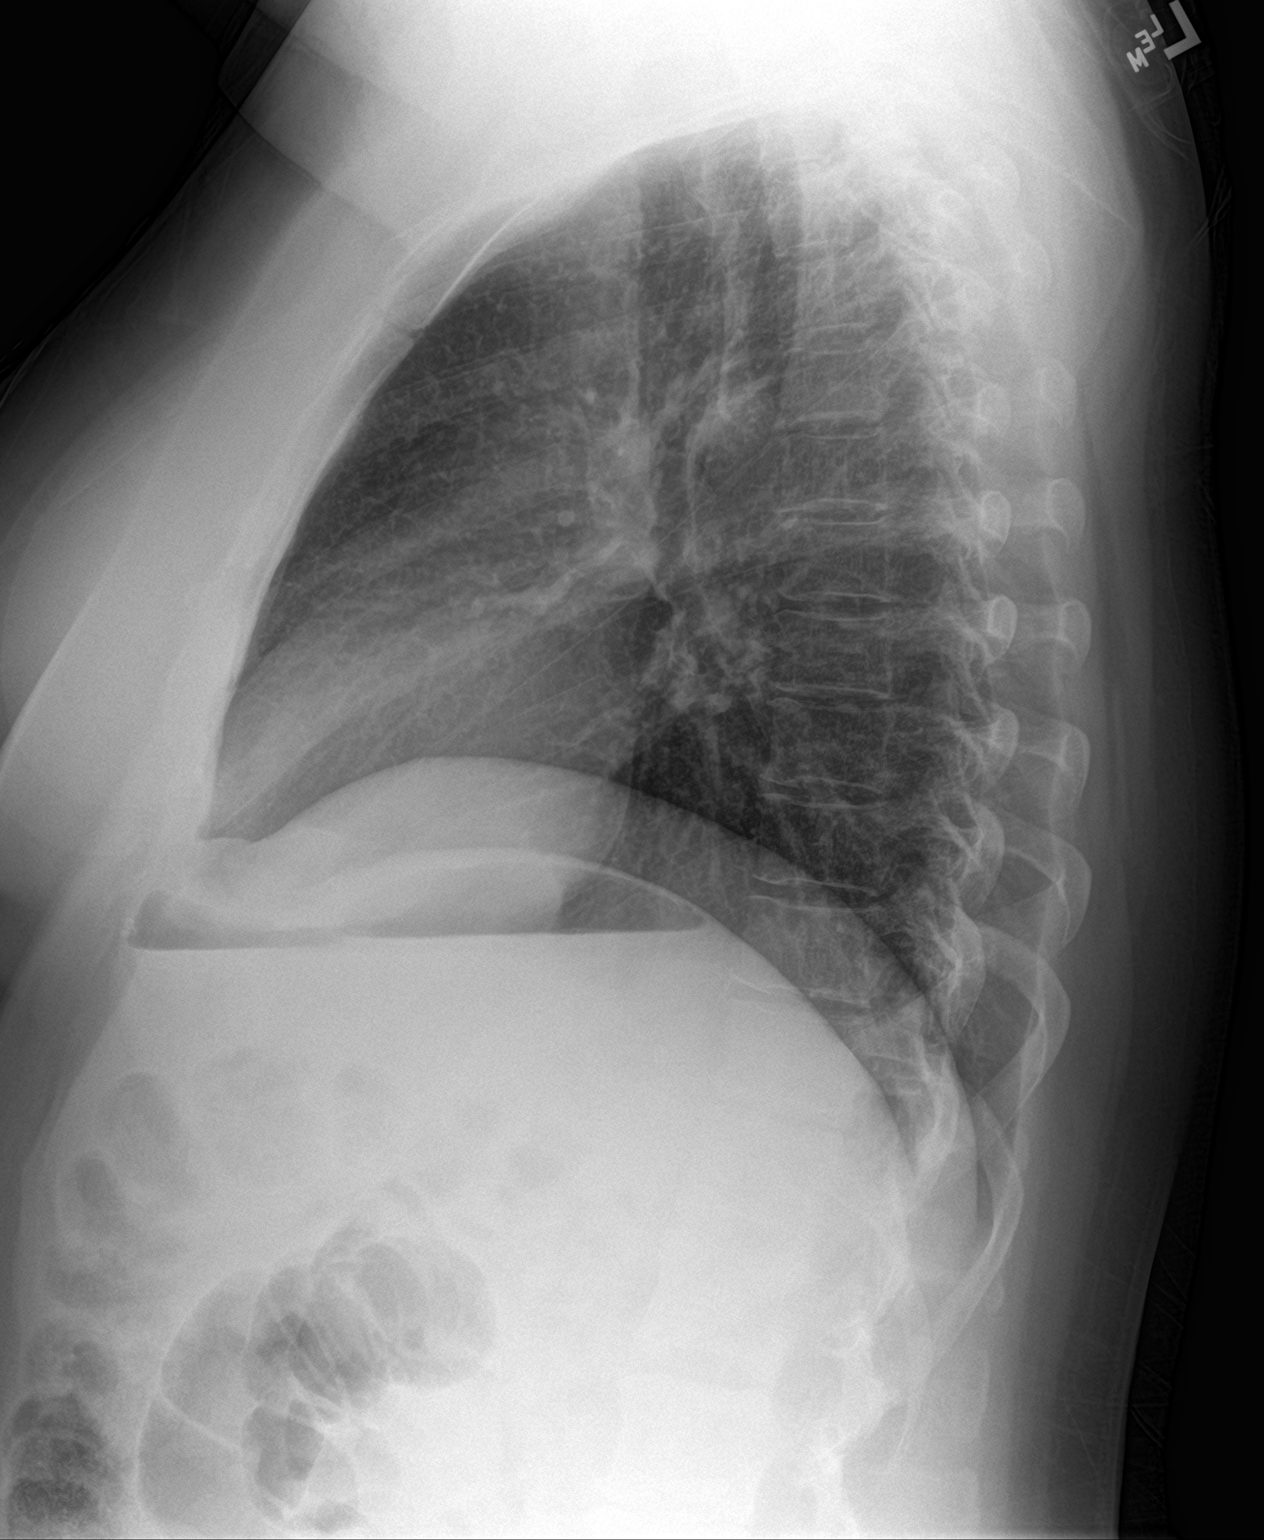

[2 of 2 positions shown; findings below may reference images not displayed]

FINDINGS: Surgical changes in the cervical spine. Mild bronchitic changes. No
acute consolidation or effusion. Normal heart size. No pneumothorax.
IMPRESSION: No active cardiopulmonary disease.  Mild bronchitic changes

## 2018-08-08 ENCOUNTER — Emergency Department: Payer: Federal, State, Local not specified - PPO

## 2018-08-08 ENCOUNTER — Emergency Department
Admission: EM | Admit: 2018-08-08 | Discharge: 2018-08-08 | Disposition: A | Discharge status: Home / Self Care | Payer: Federal, State, Local not specified - PPO | Attending: Emergency Medicine | Admitting: Emergency Medicine

## 2018-08-08 ENCOUNTER — Encounter: Payer: Self-pay | Admitting: Emergency Medicine

## 2018-08-08 DIAGNOSIS — R200 Anesthesia of skin: Secondary | ICD-10-CM | POA: Diagnosis not present

## 2018-08-08 DIAGNOSIS — R0789 Other chest pain: Secondary | ICD-10-CM | POA: Diagnosis present

## 2018-08-08 DIAGNOSIS — Z79899 Other long term (current) drug therapy: Secondary | ICD-10-CM | POA: Insufficient documentation

## 2018-08-08 DIAGNOSIS — R1013 Epigastric pain: Secondary | ICD-10-CM | POA: Diagnosis not present

## 2018-08-08 DIAGNOSIS — F17210 Nicotine dependence, cigarettes, uncomplicated: Secondary | ICD-10-CM | POA: Insufficient documentation

## 2018-08-08 DIAGNOSIS — R0602 Shortness of breath: Secondary | ICD-10-CM | POA: Insufficient documentation

## 2018-08-08 DIAGNOSIS — R079 Chest pain, unspecified: Secondary | ICD-10-CM

## 2018-08-08 LAB — CBC
HEMATOCRIT: 43.5 % (ref 35.0–47.0)
Hemoglobin: 15 g/dL (ref 12.0–16.0)
MCH: 32 pg (ref 26.0–34.0)
MCHC: 34.5 g/dL (ref 32.0–36.0)
MCV: 92.7 fL (ref 80.0–100.0)
PLATELETS: 313 10*3/uL (ref 150–440)
RBC: 4.69 MIL/uL (ref 3.80–5.20)
RDW: 13.2 % (ref 11.5–14.5)
WBC: 9.6 10*3/uL (ref 3.6–11.0)

## 2018-08-08 LAB — BASIC METABOLIC PANEL
Anion gap: 13 (ref 5–15)
BUN: 11 mg/dL (ref 6–20)
CHLORIDE: 101 mmol/L (ref 98–111)
CO2: 26 mmol/L (ref 22–32)
CREATININE: 0.86 mg/dL (ref 0.44–1.00)
Calcium: 9 mg/dL (ref 8.9–10.3)
GFR calc Af Amer: >60 mL/min (ref >60)
GFR calc non Af Amer: >60 mL/min (ref >60)
GLUCOSE: 95 mg/dL (ref 70–99)
Potassium: 3.5 mmol/L (ref 3.5–5.1)
Sodium: 140 mmol/L (ref 135–145)

## 2018-08-08 LAB — TROPONIN I: Troponin I: <0.03 ng/mL (ref <0.03)

## 2018-08-08 MED ORDER — LIDOCAINE HCL 1 % IJ SOLN
.50 | INTRAMUSCULAR | Status: DC
Start: ? — End: 2018-08-08

## 2018-08-08 NOTE — ED Provider Notes (Signed)
[facility] Emergency Department Provider Note  ____________________________________________  Time seen: Approximately 6:50 PM  I have reviewed the triage vital signs and the nursing notes.   HISTORY  Chief Complaint Chest Pain    HPI [name] is a 52 y.o. female with a history of left bundle branch block, chronic chest pain, presenting for chest pain.  The patient has been having episodes of chest pain since February and was seen here by myself 02/08/2018.  She was discharged with cardiology follow-up and had a reassuring stress test in zero 3/19.  Over the past several weeks, the patient has continued to have a "heavy" feeling in the epigastrium leading to a burning sensation in the center of the chest with an acidic taste in the mouth.  Her symptoms improve if she takes over-the-counter reflux medications. she does not have associated burping, nausea or vomiting.  Today, the patient additionally experienced left-sided sharp chest pains with numbness in the left upper extremity and shortness of breath.  The patient was [facility] with a reassuring work-up and was discharged home with a prescription for Zantac.  At this time, the patient is completely asymptomatic.  Past Medical History:  Diagnosis Date  . Left bundle branch block     Patient Active Problem List   Diagnosis Date Noted  . Special screening for malignant neoplasms, colon   . Abnormal ECG 05/20/2015  . Elevated blood pressure reading without diagnosis of hypertension 05/20/2015  . Chest pain 05/20/2015    Past Surgical History:  Procedure Laterality Date  . COLONOSCOPY WITH PROPOFOL N/A 06/13/2016   Procedure: COLONOSCOPY WITH PROPOFOL;  Surgeon: Midge Miniumarren Wohl, MD;  Location: [facility];  Service: Endoscopy;  Laterality: N/A;  . NECK SURGERY      Current Outpatient Rx  . Order #: [id number] Class: Historical Med  . Order #: [id number] Class: Historical Med     Allergies Amoxicillin  Family History  Problem Relation Age of Onset  . Cancer Mother   . Cancer Father     Social History Social History   Tobacco Use  . Smoking status: Current Every Day Smoker    Packs/day: 0.50    Types: Cigarettes  . Smokeless tobacco: Never Used  Substance Use Topics  . Alcohol use: No  . Drug use: No    Review of Systems Constitutional: No fever/chills.  No lightheadedness or syncope. Eyes: No visual changes. ENT: No sore throat. No congestion or rhinorrhea. Cardiovascular: Positive chest pain. Denies palpitations. Respiratory: Positive shortness of breath.  No cough. Gastrointestinal: Positive epigastric abdominal pain.  No nausea, no vomiting.  No diarrhea.  No constipation. Genitourinary: Negative for dysuria. Musculoskeletal: Negative for back pain.  No lower extremity swelling or calf pain. Skin: Negative for rash. Neurological: Negative for headaches. No focal numbness, tingling or weakness.     ____________________________________________   PHYSICAL EXAM:  VITAL SIGNS: ED Triage Vitals [08/08/18 1706]  Enc Vitals Group     BP 137/74     Pulse Rate 76     Resp 20     Temp 97.7 F (36.5 C)     Temp Source Oral     SpO2 99 %     Weight 210 lb (95.3 kg)     Height 5\' 8"  (1.727 m)     Head Circumference      Peak Flow      Pain Score 5     Pain Loc  Pain Edu?      Excl. in GC?     Constitutional: Alert and oriented. Answers questions appropriately. Eyes: Conjunctivae are normal.  EOMI. No scleral icterus. Head: Atraumatic. Nose: No congestion/rhinnorhea. Mouth/Throat: Mucous membranes are moist.  Neck: No stridor.  Supple.  No JVD.  No meningismus. Cardiovascular: Normal rate, regular rhythm. No murmurs, rubs or gallops.  Respiratory: Normal respiratory effort.  No accessory muscle use or retractions. Lungs CTAB.  No wheezes, rales or ronchi. Gastrointestinal: Overweight.  Soft, nontender and nondistended.  No  guarding or rebound.  No peritoneal signs. Musculoskeletal: No LE edema. No ttp in the calves or palpable cords.  Negative Homan's sign. Neurologic:  A&Ox3.  Speech is clear.  Face and smile are symmetric.  EOMI.  Moves all extremities well. Skin:  Skin is warm, dry and intact. No rash noted. Psychiatric: Mood and affect are normal. Speech and behavior are normal.  Normal judgement.  ____________________________________________   LABS (all labs ordered are listed, but only abnormal results are displayed)  Labs Reviewed  BASIC METABOLIC PANEL  CBC  TROPONIN I   ____________________________________________  EKG  ED ECG REPORT I, Anne-Caroline Sharma Covert, the attending physician, personally viewed and interpreted this ECG.   Date: 08/08/2018  EKG Time: 1659  Rate: 78  Rhythm: normal sinus rhythm; LBBB Axis: normal  Intervals:none  ST&T Change: No STEMI  EKG is compared to prior and is grossly unchanged in morphology.  The left bundle branch block is old.  She does not meet Sgarbosa criteria.  ____________________________________________  RADIOLOGY  No results found.  ____________________________________________   PROCEDURES  Procedure(s) performed: None  Procedures  Critical Care performed: No ____________________________________________   INITIAL IMPRESSION / ASSESSMENT AND PLAN / ED COURSE  Pertinent labs & imaging results that were available during my care of the patient were reviewed by me and considered in my medical decision making (see chart for details).  52 y.o. female presenting with chest pain.  Overall, the patient is hemodynamically stable.  Her EKG is unchanged without any evidence of ischemia and her troponin today is negative.  The patient has a recent cardiac stress test which does not show any abnormalities, and she also had a chest pain evaluation at [facility] yesterday which was reassuring.  Her symptoms may be indicative of primary GI cause, and she  has not seen a gastrointestinal physician or had endoscopy in the past.  I have encouraged her to continue the Zantac that she was prescribed yesterday, and follow instructions for bland diet.  I have offered the patient a second troponin but she declines at this time.  There is no evidence for PE or aortic pathology today.  She will follow-up with GI, and understands that she can follow-up with her primary care physician and cardiology as needed.  Return precautions were discussed.  ____________________________________________  FINAL CLINICAL IMPRESSION(S) / ED DIAGNOSES  Final diagnoses:  Epigastric pain  Chest pain, unspecified type  Left arm numbness  Shortness of breath         NEW MEDICATIONS STARTED DURING THIS VISIT:  New Prescriptions   No medications on file      Rockne Menghini, MD 08/08/18 [phone]

## 2018-08-08 NOTE — ED Notes (Signed)
Pt states she had epigastric/CP yesterday with nausea and dizziness so went to [facility] and had a cardiac work-up that was normal. States has LBBB and had normal stress test in March. States today pain continues now with L arm numbness and L jaw pain. Pt moving L arm on own. Color and temp good. Pt states "I'm just frustrated." alert, oriented, no distress noted. Family member at bedside.

## 2018-08-08 NOTE — Discharge Instructions (Addendum)
Is possible that your symptoms are due to a gastrointestinal cause.  Please take the Zantac that you were prescribed, and follow a bland diet.  Make a follow-up appointment with the gastroenterology physician.  You can also follow-up with your primary care physician as well as your cardiologist.  Return to the emergency department if you develop severe pain, lightheadedness or fainting, palpitations, numbness tingling or weakness, shortness of breath, or any other symptoms concerning to you.

## 2018-08-08 NOTE — ED Triage Notes (Signed)
PT arrived with complaints of center chest pain that radiates to left arm and left jaw.Pt states she feels like her heart is fluttering. Pt was seen and evaluated yesterday will no cardiac findings but states today the pain went to her jaw and arm which was not present yesterday. Pt also reports nausea but denies vomiting or diarrhea.

## 2022-10-10 ENCOUNTER — Other Ambulatory Visit: Payer: Self-pay | Admitting: Infectious Diseases

## 2022-10-10 DIAGNOSIS — R1900 Intra-abdominal and pelvic swelling, mass and lump, unspecified site: Secondary | ICD-10-CM

## 2022-10-17 ENCOUNTER — Ambulatory Visit
Admission: RE | Admit: 2022-10-17 | Discharge: 2022-10-17 | Disposition: A | Discharge status: Home / Self Care | Payer: Federal, State, Local not specified - PPO | Source: Ambulatory Visit | Attending: Infectious Diseases | Admitting: Infectious Diseases

## 2022-10-17 DIAGNOSIS — R1900 Intra-abdominal and pelvic swelling, mass and lump, unspecified site: Secondary | ICD-10-CM | POA: Diagnosis present

## 2022-10-17 MED ORDER — GADOBUTROL 1 MMOL/ML IV SOLN
9.0000 mL | Freq: Once | INTRAVENOUS | Status: AC | PRN
  Administered 2022-10-17: 9 mL via INTRAVENOUS

## 2024-07-16 ENCOUNTER — Other Ambulatory Visit: Payer: Self-pay | Admitting: Surgery

## 2024-07-16 DIAGNOSIS — M7581 Other shoulder lesions, right shoulder: Secondary | ICD-10-CM

## 2024-07-16 DIAGNOSIS — M7521 Bicipital tendinitis, right shoulder: Secondary | ICD-10-CM

## 2024-07-22 ENCOUNTER — Ambulatory Visit
Admission: RE | Admit: 2024-07-22 | Discharge: 2024-07-22 | Disposition: A | Discharge status: Home / Self Care | Source: Ambulatory Visit | Attending: Surgery | Admitting: Surgery

## 2024-07-22 DIAGNOSIS — M7581 Other shoulder lesions, right shoulder: Secondary | ICD-10-CM | POA: Diagnosis present

## 2024-07-22 DIAGNOSIS — M7521 Bicipital tendinitis, right shoulder: Secondary | ICD-10-CM | POA: Diagnosis present
